# Patient Record
Sex: Female | Born: 1935 | Race: White | Hispanic: No | Marital: Married
Health system: Southern US, Community
[De-identification: ages and names within clinical notes are randomized; demographics above are authoritative.]

---

## 2014-02-14 ENCOUNTER — Inpatient Hospital Stay
Admission: AD | Admit: 2014-02-14 | Discharge: 2014-03-09 | Disposition: A | Discharge status: Hospice | Payer: Self-pay | Source: Ambulatory Visit | Attending: Internal Medicine | Admitting: Internal Medicine

## 2014-02-15 ENCOUNTER — Other Ambulatory Visit (HOSPITAL_COMMUNITY): Payer: Self-pay

## 2014-02-15 LAB — BASIC METABOLIC PANEL
Anion gap: 14 (ref 5–15)
BUN: 27 mg/dL — ABNORMAL HIGH (ref 6–23)
CALCIUM: 9 mg/dL (ref 8.4–10.5)
CO2: 28 mEq/L (ref 19–32)
CREATININE: 0.56 mg/dL (ref 0.50–1.10)
Chloride: 96 mEq/L (ref 96–112)
GFR calc Af Amer: 88 mL/min — ABNORMAL LOW (ref >90)
GFR, EST NON AFRICAN AMERICAN: 76 mL/min — AB (ref >90)
Glucose, Bld: 240 mg/dL — ABNORMAL HIGH (ref 70–99)
Potassium: 4 mEq/L (ref 3.7–5.3)
Sodium: 138 mEq/L (ref 137–147)

## 2014-02-15 LAB — CBC
HEMATOCRIT: 44.3 % (ref 36.0–46.0)
Hemoglobin: 14.3 g/dL (ref 12.0–15.0)
MCH: 30.4 pg (ref 26.0–34.0)
MCHC: 32.3 g/dL (ref 30.0–36.0)
MCV: 94.1 fL (ref 78.0–100.0)
PLATELETS: 259 10*3/uL (ref 150–400)
RBC: 4.71 MIL/uL (ref 3.87–5.11)
RDW: 13.2 % (ref 11.5–15.5)
WBC: 17.5 10*3/uL — AB (ref 4.0–10.5)

## 2014-02-15 LAB — PROCALCITONIN

## 2014-02-15 LAB — TSH: TSH: 0.258 u[IU]/mL — ABNORMAL LOW (ref 0.350–4.500)

## 2014-02-15 LAB — PREALBUMIN: Prealbumin: 27.2 mg/dL (ref 17.0–34.0)

## 2014-02-16 NOTE — Progress Notes (Signed)
[facility]                                                                                              Progress note     Patient Demographics  Crystal Hodges, is a 80 y.o. female  ZOX:[id number]  [id number]  DOB - 09-Nov-1914  Admit date - 02/14/2014  Admitting Physician Elnora Morrison, MD  Outpatient Primary MD for the patient is Pcp Not In System  LOS - 2   Chief complaint      Respiratory failure   COPD   Right lower lobe pneumonia   Diabetes mellitus type 2   Hypertension   Subjective:   Crystal Hodges today has,  complains of shortness of breath and anxiety  Objective:   Vital signs  Temperature 96  Heart rate 50  Respiratory rate 20  Blood pressure110/60  Pulse ox 99%    Exam Awake Alert,  confused, No new F.N deficits, [place].AT,PERRAL Supple Neck,No JVD, No cervical lymphadenopathy appriciated.  Symmetrical Chest wall movement,  decreased breath sounds bilaterally with scattered rhonchi  RRR,No Gallops,Rubs or new Murmurs, No Parasternal Heave +ve B.Sounds, Abd Soft, Non tender, No organomegaly appriciated, No rebound - guarding or rigidity. No Cyanosis, Clubbing or edema, No new Rash or bruise     I&Os -1665       Data Review   CBC  Recent Labs Lab 02/15/14 0555  WBC 17.5*  HGB 14.3  HCT 44.3  PLT 259  MCV 94.1  MCH 30.4  MCHC 32.3  RDW 13.2    Chemistries   Recent Labs Lab 02/15/14 0555  NA 138  K 4.0  CL 96  CO2 28  GLUCOSE 240*  BUN 27*  CREATININE 0.56  CALCIUM 9.0   ------------------------------------------------------------------------------------------------------------------ CrCl is unknown because there is no height on file for the current visit. ------------------------------------------------------------------------------------------------------------------ No results found for this basename: HGBA1C,  in the last 72  hours ------------------------------------------------------------------------------------------------------------------ No results found for this basename: CHOL, HDL, LDLCALC, TRIG, CHOLHDL, LDLDIRECT,  in the last 72 hours ------------------------------------------------------------------------------------------------------------------  Recent Labs  02/15/14 0555  TSH 0.258*   ------------------------------------------------------------------------------------------------------------------ No results found for this basename: VITAMINB12, FOLATE, FERRITIN, TIBC, IRON, RETICCTPCT,  in the last 72 hours  Coagulation profile No results found for this basename: INR, PROTIME,  in the last 168 hours  No results found for this basename: DDIMER,  in the last 72 hours  Cardiac Enzymes No results found for this basename: CK, CKMB, TROPONINI, MYOGLOBIN,  in the last 168 hours ------------------------------------------------------------------------------------------------------------------ No components found with this basename: POCBNP,   Micro Results No results found for this or any previous visit (from the past 240 hour(s)).     Assessment & Plan    respiratory failure, acute and in pain with high flow oxygen Right lower lobe pneumonia on Rocephin and Zithromax COPD continue with bronchodilators and steroids Hypertension controlled Diabetes mellitus type 2 on Levemir and sliding scale Hyperlipidemia on statin Elevated troponin with ejection fraction by echo 50% likely demand ischemia Generalized weakness PT and OT following   Code Status: DO NOT RESUSCITATE   DVT  Prophylaxis   Lovenox   Carron CurieHijazi, Chadd Tollison M.D on 02/16/2014 at 10:33 AM

## 2014-02-20 ENCOUNTER — Other Ambulatory Visit (HOSPITAL_COMMUNITY): Payer: Self-pay

## 2014-02-20 LAB — BASIC METABOLIC PANEL
Anion gap: 11 (ref 5–15)
BUN: 29 mg/dL — AB (ref 6–23)
CALCIUM: 8.9 mg/dL (ref 8.4–10.5)
CO2: 31 mEq/L (ref 19–32)
Chloride: 97 mEq/L (ref 96–112)
Creatinine, Ser: 0.59 mg/dL (ref 0.50–1.10)
GFR, EST AFRICAN AMERICAN: 87 mL/min — AB (ref >90)
GFR, EST NON AFRICAN AMERICAN: 75 mL/min — AB (ref >90)
GLUCOSE: 193 mg/dL — AB (ref 70–99)
POTASSIUM: 5 meq/L (ref 3.7–5.3)
Sodium: 139 mEq/L (ref 137–147)

## 2014-02-20 LAB — BLOOD GAS, ARTERIAL
Acid-Base Excess: 7.1 mmol/L — ABNORMAL HIGH (ref 0.0–2.0)
BICARBONATE: 31.5 meq/L — AB (ref 20.0–24.0)
Delivery systems: POSITIVE
Expiratory PAP: 6
FIO2: 0.6 %
Inspiratory PAP: 12
O2 Saturation: 95.1 %
PH ART: 7.428 (ref 7.350–7.450)
Patient temperature: 98.6
TCO2: 33 mmol/L (ref 0–100)
pCO2 arterial: 48.6 mmHg — ABNORMAL HIGH (ref 35.0–45.0)
pO2, Arterial: 74.2 mmHg — ABNORMAL LOW (ref 80.0–100.0)

## 2014-02-20 LAB — CBC
HCT: 40.5 % (ref 36.0–46.0)
HEMOGLOBIN: 13 g/dL (ref 12.0–15.0)
MCH: 30.4 pg (ref 26.0–34.0)
MCHC: 32.1 g/dL (ref 30.0–36.0)
MCV: 94.8 fL (ref 78.0–100.0)
Platelets: 236 10*3/uL (ref 150–400)
RBC: 4.27 MIL/uL (ref 3.87–5.11)
RDW: 13.7 % (ref 11.5–15.5)
WBC: 16 10*3/uL — ABNORMAL HIGH (ref 4.0–10.5)

## 2014-02-21 LAB — POTASSIUM: Potassium: 4.3 mEq/L (ref 3.7–5.3)

## 2014-02-21 NOTE — Progress Notes (Signed)
[facility]                                                                                              Progress note     Patient Demographics  Crystal Hodges, is a 80 y.o. female  ZOX:[id number]  [id number]  DOB - 06-06-1915  Admit date - 02/14/2014  Admitting Physician Elnora MorrisonAhmad B Barakat, MD  Outpatient Primary MD for the patient is Pcp Not In System  LOS - 7   Chief complaint      Respiratory failure   COPD   Right lower lobe pneumonia   Diabetes mellitus type 2   Hypertension   Subjective:   Crystal Hodges today he feels better, events of yesterday reviewed.  Objective:   Vital signs  Temperature 98.4  Heart rate 63  Respiratory rate 18 Blood pressure 133/70  Pulse ox 93%    Exam Awake Alert,  confused, No new F.N deficits, [place].AT,PERRAL Supple Neck,No JVD, No cervical lymphadenopathy appriciated.  Symmetrical Chest wall movement,  decreased breath sounds bilaterally with scattered rhonchi  RRR,No Gallops,Rubs or new Murmurs, No Parasternal Heave +ve B.Sounds, Abd Soft, Non tender, No organomegaly appriciated, No rebound - guarding or rigidity. No Cyanosis, Clubbing or edema, No new Rash or bruise     I&Os Unknown       Data Review   CBC  Recent Labs Lab 02/15/14 0555 02/20/14 0445  WBC 17.5* 16.0*  HGB 14.3 13.0  HCT 44.3 40.5  PLT 259 236  MCV 94.1 94.8  MCH 30.4 30.4  MCHC 32.3 32.1  RDW 13.2 13.7    Chemistries   Recent Labs Lab 02/15/14 0555 02/20/14 0445 02/21/14 0500  NA 138 139  --   K 4.0 5.0 4.3  CL 96 97  --   CO2 28 31  --   GLUCOSE 240* 193*  --   BUN 27* 29*  --   CREATININE 0.56 0.59  --   CALCIUM 9.0 8.9  --    ------------------------------------------------------------------------------------------------------------------ CrCl is unknown because there is no height on file for the current  visit. ------------------------------------------------------------------------------------------------------------------ No results found for this basename: HGBA1C,  in the last 72 hours ------------------------------------------------------------------------------------------------------------------ No results found for this basename: CHOL, HDL, LDLCALC, TRIG, CHOLHDL, LDLDIRECT,  in the last 72 hours ------------------------------------------------------------------------------------------------------------------ No results found for this basename: TSH, T4TOTAL, FREET3, T3FREE, THYROIDAB,  in the last 72 hours ------------------------------------------------------------------------------------------------------------------ No results found for this basename: VITAMINB12, FOLATE, FERRITIN, TIBC, IRON, RETICCTPCT,  in the last 72 hours  Coagulation profile No results found for this basename: INR, PROTIME,  in the last 168 hours  No results found for this basename: DDIMER,  in the last 72 hours  Cardiac Enzymes No results found for this basename: CK, CKMB, TROPONINI, MYOGLOBIN,  in the last 168 hours ------------------------------------------------------------------------------------------------------------------ No components found with this basename: POCBNP,   Micro Results No results found for this or any previous visit (from the past 240 hour(s)).     Assessment & Plan    respiratory failure, acute on chronic with high flow oxygen/Neb Rxs; probable aspiration Right lower lobe pneumonia on  meropenem COPD continue with bronchodilators and steroids by mouth Hypertension controlled Diabetes mellitus type 2 on Levemir and sliding scale Hyperlipidemia on statin Elevated troponin with ejection fraction by echo 50% likely demand ischemia Generalized weakness PT and OT following Protein calorie malnutrition/dysphasia continue on dysphagia 2 with nectar thickness  Plan  Continue same  treatment  No change  Grave prognosis Code Status: DO NOT RESUSCITATE   DVT Prophylaxis   Lovenox   Carron Curie M.D on 02/21/2014 at 1:27 PM

## 2014-02-22 NOTE — Progress Notes (Signed)
[facility]                                                                                              Progress note     Patient Demographics  Crystal Hodges, is a 80 y.o. female  ZOX:[id number]  [id number]  DOB - August 21, 1914  Admit date - 02/14/2014  Admitting Physician Elnora Morrison, MD  Outpatient Primary MD for the patient is Pcp Not In System  LOS - 8   Chief complaint      Respiratory failure   COPD   Right lower lobe pneumonia   Diabetes mellitus type 2   Hypertension   Subjective:   Crystal Hodges has no new complaints.  Objective:   Vital signs  Temperature 96.2 Heart rate 60  Respiratory rate 18 Blood pressure 106/54  Pulse ox 100%    Exam Awake Alert,  confused, No new F.N deficits, [place].AT,PERRAL Supple Neck,No JVD, No cervical lymphadenopathy appriciated.  Symmetrical Chest wall movement,  decreased breath sounds bilaterally with scattered rhonchi  RRR,No Gallops,Rubs or new Murmurs, No Parasternal Heave +ve B.Sounds, Abd Soft, Non tender, No organomegaly appriciated, No rebound - guarding or rigidity. No Cyanosis, Clubbing or edema, No new Rash or bruise     I&Os Unknown       Data Review   CBC  Recent Labs Lab 02/20/14 0445  WBC 16.0*  HGB 13.0  HCT 40.5  PLT 236  MCV 94.8  MCH 30.4  MCHC 32.1  RDW 13.7    Chemistries   Recent Labs Lab 02/20/14 0445 02/21/14 0500  NA 139  --   K 5.0 4.3  CL 97  --   CO2 31  --   GLUCOSE 193*  --   BUN 29*  --   CREATININE 0.59  --   CALCIUM 8.9  --    ------------------------------------------------------------------------------------------------------------------ CrCl is unknown because there is no height on file for the current visit. ------------------------------------------------------------------------------------------------------------------ No results found for this basename:  HGBA1C,  in the last 72 hours ------------------------------------------------------------------------------------------------------------------ No results found for this basename: CHOL, HDL, LDLCALC, TRIG, CHOLHDL, LDLDIRECT,  in the last 72 hours ------------------------------------------------------------------------------------------------------------------ No results found for this basename: TSH, T4TOTAL, FREET3, T3FREE, THYROIDAB,  in the last 72 hours ------------------------------------------------------------------------------------------------------------------ No results found for this basename: VITAMINB12, FOLATE, FERRITIN, TIBC, IRON, RETICCTPCT,  in the last 72 hours  Coagulation profile No results found for this basename: INR, PROTIME,  in the last 168 hours  No results found for this basename: DDIMER,  in the last 72 hours  Cardiac Enzymes No results found for this basename: CK, CKMB, TROPONINI, MYOGLOBIN,  in the last 168 hours ------------------------------------------------------------------------------------------------------------------ No components found with this basename: POCBNP,   Micro Results No results found for this or any previous visit (from the past 240 hour(s)).     Assessment & Plan    respiratory failure, acute on chronic with high flow oxygen/Neb Rxs; probable aspiration Right lower lobe pneumonia on meropenem COPD continue with bronchodilators and steroids by mouth Hypertension controlled Diabetes mellitus type 2 on Levemir and sliding scale Hyperlipidemia on statin Elevated troponin  with ejection fraction by echo 50% likely demand ischemia Generalized weakness PT and OT following Protein calorie malnutrition/dysphasia continue on dysphagia 2 with nectar thickness  Plan  Continue same treatment  No change  Grave prognosis Code Status: DO NOT RESUSCITATE   DVT Prophylaxis   Lovenox   Carron CurieHijazi, Crystal Hodges M.D on 02/22/2014 at 12:12 PM

## 2014-02-23 ENCOUNTER — Other Ambulatory Visit (HOSPITAL_COMMUNITY): Payer: Self-pay

## 2014-02-23 LAB — CBC
HCT: 42.3 % (ref 36.0–46.0)
HEMOGLOBIN: 13.3 g/dL (ref 12.0–15.0)
MCH: 30.3 pg (ref 26.0–34.0)
MCHC: 31.4 g/dL (ref 30.0–36.0)
MCV: 96.4 fL (ref 78.0–100.0)
Platelets: 235 10*3/uL (ref 150–400)
RBC: 4.39 MIL/uL (ref 3.87–5.11)
RDW: 13.8 % (ref 11.5–15.5)
WBC: 14.9 10*3/uL — ABNORMAL HIGH (ref 4.0–10.5)

## 2014-02-23 LAB — BASIC METABOLIC PANEL
Anion gap: 8 (ref 5–15)
Anion gap: 12 (ref 5–15)
BUN: 39 mg/dL — ABNORMAL HIGH (ref 6–23)
BUN: 41 mg/dL — ABNORMAL HIGH (ref 6–23)
CALCIUM: 9 mg/dL (ref 8.4–10.5)
CO2: 32 mEq/L (ref 19–32)
CO2: 29 mEq/L (ref 19–32)
Calcium: 8.7 mg/dL (ref 8.4–10.5)
Chloride: 99 mEq/L (ref 96–112)
Chloride: 96 mEq/L (ref 96–112)
Creatinine, Ser: 0.54 mg/dL (ref 0.50–1.10)
Creatinine, Ser: 0.5 mg/dL (ref 0.50–1.10)
GFR calc Af Amer: 89 mL/min — ABNORMAL LOW (ref >90)
GFR, EST NON AFRICAN AMERICAN: 77 mL/min — AB (ref >90)
GFR, EST NON AFRICAN AMERICAN: 79 mL/min — AB (ref >90)
GLUCOSE: 132 mg/dL — AB (ref 70–99)
Glucose, Bld: 196 mg/dL — ABNORMAL HIGH (ref 70–99)
POTASSIUM: 4.5 meq/L (ref 3.7–5.3)
Potassium: 4.5 mEq/L (ref 3.7–5.3)
SODIUM: 137 meq/L (ref 137–147)
Sodium: 139 mEq/L (ref 137–147)

## 2014-02-23 LAB — MAGNESIUM: MAGNESIUM: 2.3 mg/dL (ref 1.5–2.5)

## 2014-02-23 LAB — TROPONIN I: Troponin I: <0.3 ng/mL (ref <0.30)

## 2014-02-23 NOTE — Progress Notes (Addendum)
[facility]                                                                                              Progress note     Patient Demographics  Crystal Hodges, is a 80 y.o. female  [id number]  [id number]  DOB - May 13, 1915  Admit date - 02/14/2014  Admitting Physician Elnora MorrisonAhmad B Barakat, MD  Outpatient Primary MD for the patient is Pcp Not In System  LOS - 9   Chief complaint      Respiratory failure   COPD   Right lower lobe pneumonia   Diabetes mellitus type 2   Hypertension   Subjective:   Crystal Hodges has no new complaints.  Objective:   Vital signs  Temperature 96.5 Heart rate 53  Respiratory rate 18 Blood pressure 113/52  Pulse ox 100%    Exam Awake Alert,  confused, No new F.N deficits, [place].AT,PERRAL Supple Neck,No JVD, No cervical lymphadenopathy appriciated.  Symmetrical Chest wall movement,  decreased breath sounds bilaterally with scattered rhonchi  RRR,No Gallops,Rubs or new Murmurs, No Parasternal Heave +ve B.Sounds, Abd Soft, Non tender, No organomegaly appriciated, No rebound - guarding or rigidity. No Cyanosis, Clubbing or edema, No new Rash or bruise     I&Os Unknown       Data Review   CBC  Recent Labs Lab 02/20/14 0445 02/23/14 0900  WBC 16.0* 14.9*  HGB 13.0 13.3  HCT 40.5 42.3  PLT 236 235  MCV 94.8 96.4  MCH 30.4 30.3  MCHC 32.1 31.4  RDW 13.7 13.8    Chemistries   Recent Labs Lab 02/20/14 0445 02/21/14 0500 02/23/14 0900  NA 139  --  139  K 5.0 4.3 4.5  CL 97  --  99  CO2 31  --  32  GLUCOSE 193*  --  132*  BUN 29*  --  39*  CREATININE 0.59  --  0.54  CALCIUM 8.9  --  8.7   ------------------------------------------------------------------------------------------------------------------ CrCl is unknown because there is no height on file for the current  visit. ------------------------------------------------------------------------------------------------------------------ No results found for this basename: HGBA1C,  in the last 72 hours ------------------------------------------------------------------------------------------------------------------ No results found for this basename: CHOL, HDL, LDLCALC, TRIG, CHOLHDL, LDLDIRECT,  in the last 72 hours ------------------------------------------------------------------------------------------------------------------ No results found for this basename: TSH, T4TOTAL, FREET3, T3FREE, THYROIDAB,  in the last 72 hours ------------------------------------------------------------------------------------------------------------------ No results found for this basename: VITAMINB12, FOLATE, FERRITIN, TIBC, IRON, RETICCTPCT,  in the last 72 hours  Coagulation profile No results found for this basename: INR, PROTIME,  in the last 168 hours  No results found for this basename: DDIMER,  in the last 72 hours  Cardiac Enzymes No results found for this basename: CK, CKMB, TROPONINI, MYOGLOBIN,  in the last 168 hours ------------------------------------------------------------------------------------------------------------------ No components found with this basename: POCBNP,   Micro Results No results found for this or any previous visit (from the past 240 hour(s)).     Assessment & Plan   Respiratory failure, acute on chronic with high flow oxygen/Neb Rxs;  Right lower lobe pneumonia on meropenem COPD continue with bronchodilators and steroids  by mouth Hypertension controlled Diabetes mellitus type 2 on Levemir and sliding scale Hyperlipidemia on statin Elevated troponin with ejection fraction by echo 50% likely demand ischemia Generalized weakness PT and OT following Protein calorie malnutrition/dysphasia continue on dysphagia 2 with nectar thickness  Plan  Continue same treatment  No  change  Grave prognosis Code Status: DO NOT RESUSCITATE   DVT Prophylaxis   Lovenox Addendum/critical care time 45 minutes Patient developed acute respiratory distress while at the commode. She was placed on 100%/BiPAP Chest x-ray ordered KUB ordered On physical examination Abdomen protuberant Chest scattered rhonchi and wheezing Patient received Lasix IV, albuterol by nebulizer, morphine sulfate 2 mg IV Serial troponins ordered    Carron Curie M.D on 02/23/2014 at 11:11 AM

## 2014-02-24 ENCOUNTER — Other Ambulatory Visit (HOSPITAL_COMMUNITY): Payer: Self-pay

## 2014-02-24 LAB — TROPONIN I: Troponin I: <0.3 ng/mL (ref <0.30)

## 2014-02-24 NOTE — Progress Notes (Signed)
[facility]                                                                                              Progress note     Patient Demographics  Crystal Hodges, is a 80 y.o. female  WUJ:[id number]  [id number]  DOB - 01/02/1915  Admit date - 02/14/2014  Admitting Physician Elnora Morrison, MD  Outpatient Primary MD for the patient is Pcp Not In System  LOS - 10   Chief complaint      Respiratory failure   COPD   Right lower lobe pneumonia   Diabetes mellitus type 2   Hypertension   Subjective:   Makhia Vosler has no new complaints.  Objective:   Vital signs  Temperature 96.8 Heart rate 47  Respiratory rate 16 Blood pressure 113/60  Pulse ox 99%  Exam Awake Alert,  confused, No new F.N deficits, [place].AT,PERRAL Supple Neck,No JVD, No cervical lymphadenopathy appriciated.  Symmetrical Chest wall movement,  decreased breath sounds bilaterally with scattered rhonchi  RRR,No Gallops,Rubs or new Murmurs, No Parasternal Heave +ve B.Sounds, Abd Soft, Non tender, No organomegaly appriciated, No rebound - guarding or rigidity. No Cyanosis, Clubbing or edema, No new Rash or bruise     I&Os Unknown   Data Review   CBC  Recent Labs Lab 02/20/14 0445 02/23/14 0900  WBC 16.0* 14.9*  HGB 13.0 13.3  HCT 40.5 42.3  PLT 236 235  MCV 94.8 96.4  MCH 30.4 30.3  MCHC 32.1 31.4  RDW 13.7 13.8    Chemistries   Recent Labs Lab 02/20/14 0445 02/21/14 0500 02/23/14 0900 02/23/14 1435  NA 139  --  139 137  K 5.0 4.3 4.5 4.5  CL 97  --  99 96  CO2 31  --  32 29  GLUCOSE 193*  --  132* 196*  BUN 29*  --  39* 41*  CREATININE 0.59  --  0.54 0.50  CALCIUM 8.9  --  8.7 9.0  MG  --   --   --  2.3   ------------------------------------------------------------------------------------------------------------------ CrCl is unknown because there is no height on file for the  current visit. ------------------------------------------------------------------------------------------------------------------ No results found for this basename: HGBA1C,  in the last 72 hours ------------------------------------------------------------------------------------------------------------------ No results found for this basename: CHOL, HDL, LDLCALC, TRIG, CHOLHDL, LDLDIRECT,  in the last 72 hours ------------------------------------------------------------------------------------------------------------------ No results found for this basename: TSH, T4TOTAL, FREET3, T3FREE, THYROIDAB,  in the last 72 hours ------------------------------------------------------------------------------------------------------------------ No results found for this basename: VITAMINB12, FOLATE, FERRITIN, TIBC, IRON, RETICCTPCT,  in the last 72 hours  Coagulation profile No results found for this basename: INR, PROTIME,  in the last 168 hours  No results found for this basename: DDIMER,  in the last 72 hours  Cardiac Enzymes  Recent Labs Lab 02/23/14 1435 02/23/14 2045 02/24/14 0650  TROPONINI <0.30 <0.30 <0.30   ------------------------------------------------------------------------------------------------------------------ No components found with this basename: POCBNP,   Micro Results No results found for this or any previous visit (from the past 240 hour(s)).     Assessment & Plan   Respiratory failure, acute on chronic with  high flow oxygen/Neb Rxs;  Right lower lobe and retrocardiac pneumonia on meropenem COPD continue with bronchodilators and steroids by mouth Hypertension controlled Diabetes mellitus type 2 on Levemir and sliding scale Hyperlipidemia on statin Elevated troponin with ejection fraction by echo 50% likely demand ischemia Generalized weakness PT and OT following Protein calorie malnutrition/dysphasia continue on dysphagia 2 with nectar  thickness  Plan  Restart feeding  Grave prognosis Code Status: DO NOT RESUSCITATE   DVT Prophylaxis   Lovenox   Carron CurieHijazi, Tena Linebaugh M.D on 02/24/2014 at 2:29 PM

## 2014-02-25 NOTE — Progress Notes (Signed)
[facility]                                                                                              Progress note     Patient Demographics  Crystal Hodges, is a 80 y.o. female  XBJ:[id number]  [id number]  DOB - [birth year]  Admit date - 02/14/2014  Admitting Physician Elnora MorrisonAhmad B Barakat, MD  Outpatient Primary MD for the patient is Pcp Not In System  LOS - 11   Chief complaint      Respiratory failure   COPD   Right lower lobe pneumonia   Diabetes mellitus type 2   Hypertension   Subjective:   Crystal Hodges confused and cannot give any history.  Objective:   Vital signs  Temperature 96.8 Heart rate 57 Respiratory rate 16 Blood pressure 146/75  Pulse ox 100%  Exam Awake Alert,  confused, No new F.N deficits, [place].AT,PERRAL Supple Neck,No JVD, No cervical lymphadenopathy appriciated.  Symmetrical Chest wall movement,  decreased breath sounds bilaterally with scattered rhonchi  RRR,No Gallops,Rubs or new Murmurs, No Parasternal Heave +ve B.Sounds, Abd Soft, Non tender, No organomegaly appriciated, No rebound - guarding or rigidity. No Cyanosis, Clubbing or edema, No new Rash or bruise     I&Os Unknown   Data Review   CBC  Recent Labs Lab 02/20/14 0445 02/23/14 0900  WBC 16.0* 14.9*  HGB 13.0 13.3  HCT 40.5 42.3  PLT 236 235  MCV 94.8 96.4  MCH 30.4 30.3  MCHC 32.1 31.4  RDW 13.7 13.8    Chemistries   Recent Labs Lab 02/20/14 0445 02/21/14 0500 02/23/14 0900 02/23/14 1435  NA 139  --  139 137  K 5.0 4.3 4.5 4.5  CL 97  --  99 96  CO2 31  --  32 29  GLUCOSE 193*  --  132* 196*  BUN 29*  --  39* 41*  CREATININE 0.59  --  0.54 0.50  CALCIUM 8.9  --  8.7 9.0  MG  --   --   --  2.3   ------------------------------------------------------------------------------------------------------------------ CrCl is unknown because there is no height on file  for the current visit. ------------------------------------------------------------------------------------------------------------------ No results found for this basename: HGBA1C,  in the last 72 hours ------------------------------------------------------------------------------------------------------------------ No results found for this basename: CHOL, HDL, LDLCALC, TRIG, CHOLHDL, LDLDIRECT,  in the last 72 hours ------------------------------------------------------------------------------------------------------------------ No results found for this basename: TSH, T4TOTAL, FREET3, T3FREE, THYROIDAB,  in the last 72 hours ------------------------------------------------------------------------------------------------------------------ No results found for this basename: VITAMINB12, FOLATE, FERRITIN, TIBC, IRON, RETICCTPCT,  in the last 72 hours  Coagulation profile No results found for this basename: INR, PROTIME,  in the last 168 hours  No results found for this basename: DDIMER,  in the last 72 hours  Cardiac Enzymes  Recent Labs Lab 02/23/14 1435 02/23/14 2045 02/24/14 0650  TROPONINI <0.30 <0.30 <0.30   ------------------------------------------------------------------------------------------------------------------ No components found with this basename: POCBNP,   Micro Results No results found for this or any previous visit (from the past 240 hour(s)).     Assessment & Plan   Respiratory failure, acute on chronic  with high flow oxygen/Neb Rxs;  Right lower lobe and retrocardiac pneumonia on meropenem COPD continue with bronchodilators and steroids by mouth Hypertension controlled Diabetes mellitus type 2 on Levemir and sliding scale Hyperlipidemia on statin Elevated troponin with ejection fraction by echo 50% likely demand ischemia Generalized weakness PT and OT following Protein calorie malnutrition/dysphasia continue on dysphagia 2 with nectar  thickness  Plan  Continue same management  Grave prognosis Code Status: DO NOT RESUSCITATE   DVT Prophylaxis   Lovenox   Carron Curie M.D on 02/25/2014 at 1:27 PM

## 2014-02-27 LAB — CBC
HEMATOCRIT: 28.9 % — AB (ref 36.0–46.0)
Hemoglobin: 9.4 g/dL — ABNORMAL LOW (ref 12.0–15.0)
MCH: 31.1 pg (ref 26.0–34.0)
MCHC: 32.5 g/dL (ref 30.0–36.0)
MCV: 95.7 fL (ref 78.0–100.0)
PLATELETS: 111 10*3/uL — AB (ref 150–400)
RBC: 3.02 MIL/uL — ABNORMAL LOW (ref 3.87–5.11)
RDW: 13.9 % (ref 11.5–15.5)
WBC: 9.2 10*3/uL (ref 4.0–10.5)

## 2014-02-27 LAB — BASIC METABOLIC PANEL
Anion gap: 13 (ref 5–15)
BUN: 31 mg/dL — AB (ref 6–23)
CO2: 26 meq/L (ref 19–32)
Calcium: 8.9 mg/dL (ref 8.4–10.5)
Chloride: 101 mEq/L (ref 96–112)
Creatinine, Ser: 0.44 mg/dL — ABNORMAL LOW (ref 0.50–1.10)
GFR calc Af Amer: >90 mL/min (ref >90)
GFR, EST NON AFRICAN AMERICAN: 83 mL/min — AB (ref >90)
GLUCOSE: 116 mg/dL — AB (ref 70–99)
Potassium: 4.5 mEq/L (ref 3.7–5.3)
SODIUM: 140 meq/L (ref 137–147)

## 2014-02-27 LAB — PRO B NATRIURETIC PEPTIDE: Pro B Natriuretic peptide (BNP): 810.5 pg/mL — ABNORMAL HIGH (ref 0–450)

## 2014-02-28 NOTE — Progress Notes (Signed)
[facility]                                                                                              Progress note     Patient Demographics  Crystal Hodges, is a 80 y.o. female  UJW:[id number]  FAO:[id number]  DOB - 23-Nov-1914  Admit date - 02/14/2014  Admitting Physician Elnora MorrisonAhmad B Barakat, MD  Outpatient Primary MD for the patient is Pcp Not In System  LOS - 14   Chief complaint      Respiratory failure   COPD   Right lower lobe pneumonia   Diabetes mellitus type 2   Hypertension   Subjective:   Crystal Hodges confused and cannot give any history.  Objective:   Vital signs  Temperature 96.1 Heart rate 53 Respiratory rate 13 Blood pressure 124/68  Pulse ox 93%  Exam Awake Alert,  confused, No new F.N deficits, [place].AT,PERRAL Supple Neck,No JVD, No cervical lymphadenopathy appriciated.  Symmetrical Chest wall movement,  decreased breath sounds bilaterally with scattered rhonchi  RRR,No Gallops,Rubs or new Murmurs, No Parasternal Heave +ve B.Sounds, Abd Soft, Non tender, No organomegaly appriciated, No rebound - guarding or rigidity. No Cyanosis, Clubbing or edema, No new Rash or bruise     I&Os unknown    Data Review   CBC  Recent Labs Lab 02/23/14 0900 02/27/14 0500  WBC 14.9* 9.2  HGB 13.3 9.4*  HCT 42.3 28.9*  PLT 235 111*  MCV 96.4 95.7  MCH 30.3 31.1  MCHC 31.4 32.5  RDW 13.8 13.9    Chemistries   Recent Labs Lab 02/23/14 0900 02/23/14 1435 02/27/14 0500  NA 139 137 140  K 4.5 4.5 4.5  CL 99 96 101  CO2 32 29 26  GLUCOSE 132* 196* 116*  BUN 39* 41* 31*  CREATININE 0.54 0.50 0.44*  CALCIUM 8.7 9.0 8.9  MG  --  2.3  --    ------------------------------------------------------------------------------------------------------------------ CrCl is unknown because there is no height on file for the current  visit. ------------------------------------------------------------------------------------------------------------------ No results found for this basename: HGBA1C,  in the last 72 hours ------------------------------------------------------------------------------------------------------------------ No results found for this basename: CHOL, HDL, LDLCALC, TRIG, CHOLHDL, LDLDIRECT,  in the last 72 hours ------------------------------------------------------------------------------------------------------------------ No results found for this basename: TSH, T4TOTAL, FREET3, T3FREE, THYROIDAB,  in the last 72 hours ------------------------------------------------------------------------------------------------------------------ No results found for this basename: VITAMINB12, FOLATE, FERRITIN, TIBC, IRON, RETICCTPCT,  in the last 72 hours  Coagulation profile No results found for this basename: INR, PROTIME,  in the last 168 hours  No results found for this basename: DDIMER,  in the last 72 hours  Cardiac Enzymes  Recent Labs Lab 02/23/14 1435 02/23/14 2045 02/24/14 0650  TROPONINI <0.30 <0.30 <0.30   ------------------------------------------------------------------------------------------------------------------ No components found with this basename: POCBNP,   Micro Results No results found for this or any previous visit (from the past 240 hour(s)).     Assessment & Plan   Respiratory failure, acute on chronic with high flow oxygen/Neb Rxs;  Right lower lobe and retrocardiac pneumonia on meropenem COPD continue with bronchodilators and steroids by mouth Hypertension controlled Diabetes mellitus  type 2 on Levemir and sliding scale Hyperlipidemia on statin Elevated troponin with ejection fraction by echo 50% likely demand ischemia Generalized weakness PT and OT following Protein calorie malnutrition/dysphasia continue on dysphagia 2 with nectar thickness  Plan  Continue  same management  Grave prognosis Code Status: DO NOT RESUSCITATE   DVT Prophylaxis   Lovenox   Carron Curie M.D on 02/28/2014 at 12:50 PM

## 2014-03-01 NOTE — Progress Notes (Signed)
[facility]                                                                                              Progress note     Patient Demographics  Crystal Hodges, is a 80 y.o. female  [id number]:[id number]  HQI:[id number]  DOB - 22-Oct-1914  Admit date - 02/14/2014  Admitting Physician Elnora Morrison, MD  Outpatient Primary MD for the patient is Pcp Not In System  LOS - 15   Chief complaint      Respiratory failure   COPD   Right lower lobe pneumonia   Diabetes mellitus type 2   Hypertension   Subjective:   [name] confused and cannot give any history.  Objective:   Vital signs  Temperature 97.5 Heart rate 70 Respiratory rate 20 Blood pressure 130/72 Pulse ox 99%  Exam Awake Alert,  confused, No new F.N deficits, [place].AT,PERRAL Supple Neck,No JVD, No cervical lymphadenopathy appriciated.  Symmetrical Chest wall movement,  decreased breath sounds bilaterally with scattered rhonchi  RRR,No Gallops,Rubs or new Murmurs, No Parasternal Heave +ve B.Sounds, Abd Soft, Non tender, No organomegaly appriciated, No rebound - guarding or rigidity. No Cyanosis, Clubbing or edema, No new Rash or bruise     I&Os unknown    Data Review   CBC  Recent Labs Lab 02/23/14 0900 02/27/14 0500  WBC 14.9* 9.2  HGB 13.3 9.4*  HCT 42.3 28.9*  PLT 235 111*  MCV 96.4 95.7  MCH 30.3 31.1  MCHC 31.4 32.5  RDW 13.8 13.9    Chemistries   Recent Labs Lab 02/23/14 0900 02/23/14 1435 02/27/14 0500  NA 139 137 140  K 4.5 4.5 4.5  CL 99 96 101  CO2 32 29 26  GLUCOSE 132* 196* 116*  BUN 39* 41* 31*  CREATININE 0.54 0.50 0.44*  CALCIUM 8.7 9.0 8.9  MG  --  2.3  --    ------------------------------------------------------------------------------------------------------------------ CrCl is unknown because there is no height on file for the current  visit. ------------------------------------------------------------------------------------------------------------------ No results found for this basename: HGBA1C,  in the last 72 hours ------------------------------------------------------------------------------------------------------------------ No results found for this basename: CHOL, HDL, LDLCALC, TRIG, CHOLHDL, LDLDIRECT,  in the last 72 hours ------------------------------------------------------------------------------------------------------------------ No results found for this basename: TSH, T4TOTAL, FREET3, T3FREE, THYROIDAB,  in the last 72 hours ------------------------------------------------------------------------------------------------------------------ No results found for this basename: VITAMINB12, FOLATE, FERRITIN, TIBC, IRON, RETICCTPCT,  in the last 72 hours  Coagulation profile No results found for this basename: INR, PROTIME,  in the last 168 hours  No results found for this basename: DDIMER,  in the last 72 hours  Cardiac Enzymes  Recent Labs Lab 02/23/14 1435 02/23/14 2045 02/24/14 0650  TROPONINI <0.30 <0.30 <0.30   ------------------------------------------------------------------------------------------------------------------ No components found with this basename: POCBNP,   Micro Results No results found for this or any previous visit (from the past 240 hour(s)).     Assessment & Plan   Respiratory failure, acute on chronic with high flow oxygen/Neb Rxs;  Right lower lobe and retrocardiac pneumonia on meropenem COPD continue with bronchodilators and steroids by mouth Hypertension controlled Diabetes mellitus type  2 on Levemir and sliding scale Hyperlipidemia on statin Elevated troponin with ejection fraction by echo 50% likely demand ischemia Generalized weakness PT and OT following Protein calorie malnutrition/dysphasia continue on dysphagia 2 with nectar thickness  Plan  Continue  same treatment  Grave prognosis Code Status: DO NOT RESUSCITATE   DVT Prophylaxis   Lovenox   Carron CurieHijazi, Herson Prichard M.D on 03/01/2014 at 1:46 PM

## 2014-03-02 NOTE — Progress Notes (Signed)
[facility]                                                                                              Progress note     Patient Demographics  Crystal Hodges, is a 80 y.o. female  [id number]  [id number]  DOB - 02/09/1915  Admit date - 02/14/2014  Admitting Physician Elnora MorrisonAhmad B Barakat, MD  Outpatient Primary MD for the patient is Pcp Not In System  LOS - 16   Chief complaint      Respiratory failure   COPD   Right lower lobe pneumonia   Diabetes mellitus type 2   Hypertension   Subjective:   Crystal Hodges is pleasantly confused and cannot give any history  Objective:   Vital signs  Temperature 97.8 Heart rate 64 Respiratory rate 19 Blood pressure 106/57 Pulse ox 96%  Exam Awake Alert,  confused, No new F.N deficits, [place].AT,PERRAL Supple Neck,No JVD, No cervical lymphadenopathy appriciated.  Symmetrical Chest wall movement,  decreased breath sounds bilaterally with scattered rhonchi  RRR,No Gallops,Rubs or new Murmurs, No Parasternal Heave +ve B.Sounds, Abd Soft, Non tender, No organomegaly appriciated, No rebound - guarding or rigidity. No Cyanosis, Clubbing or edema, No new Rash or bruise     I&Os unknown    Data Review   CBC  Recent Labs Lab 02/27/14 0500  WBC 9.2  HGB 9.4*  HCT 28.9*  PLT 111*  MCV 95.7  MCH 31.1  MCHC 32.5  RDW 13.9    Chemistries   Recent Labs Lab 02/23/14 1435 02/27/14 0500  NA 137 140  K 4.5 4.5  CL 96 101  CO2 29 26  GLUCOSE 196* 116*  BUN 41* 31*  CREATININE 0.50 0.44*  CALCIUM 9.0 8.9  MG 2.3  --    ------------------------------------------------------------------------------------------------------------------ CrCl is unknown because there is no height on file for the current visit. ------------------------------------------------------------------------------------------------------------------ No results  found for this basename: HGBA1C,  in the last 72 hours ------------------------------------------------------------------------------------------------------------------ No results found for this basename: CHOL, HDL, LDLCALC, TRIG, CHOLHDL, LDLDIRECT,  in the last 72 hours ------------------------------------------------------------------------------------------------------------------ No results found for this basename: TSH, T4TOTAL, FREET3, T3FREE, THYROIDAB,  in the last 72 hours ------------------------------------------------------------------------------------------------------------------ No results found for this basename: VITAMINB12, FOLATE, FERRITIN, TIBC, IRON, RETICCTPCT,  in the last 72 hours  Coagulation profile No results found for this basename: INR, PROTIME,  in the last 168 hours  No results found for this basename: DDIMER,  in the last 72 hours  Cardiac Enzymes  Recent Labs Lab 02/23/14 1435 02/23/14 2045 02/24/14 0650  TROPONINI <0.30 <0.30 <0.30   ------------------------------------------------------------------------------------------------------------------ No components found with this basename: POCBNP,   Micro Results No results found for this or any previous visit (from the past 240 hour(s)).     Assessment & Plan   Respiratory failure, acute on chronic with high flow oxygen/Neb Rxs;  Right lower lobe and retrocardiac pneumonia on meropenem COPD continue with bronchodilators and steroids by mouth Hypertension controlled Diabetes mellitus type 2 on Levemir and sliding scale Hyperlipidemia on statin Elevated troponin with ejection fraction by echo 50% likely demand ischemia Generalized  weakness PT and OT following Protein calorie malnutrition/dysphasia continue on dysphagia 2 with nectar thickness  Plan  Check labs in a.m. Discussed with family with no escalation of care  Grave prognosis Code Status: DO NOT RESUSCITATE   DVT Prophylaxis    Lovenox   Carron Curie M.D on 03/02/2014 at 11:43 AM

## 2014-03-03 LAB — CBC
HCT: 25 % — ABNORMAL LOW (ref 36.0–46.0)
Hemoglobin: 7.9 g/dL — ABNORMAL LOW (ref 12.0–15.0)
MCH: 32.1 pg (ref 26.0–34.0)
MCHC: 31.6 g/dL (ref 30.0–36.0)
MCV: 101.6 fL — AB (ref 78.0–100.0)
Platelets: 119 10*3/uL — ABNORMAL LOW (ref 150–400)
RBC: 2.46 MIL/uL — AB (ref 3.87–5.11)
RDW: 19.2 % — ABNORMAL HIGH (ref 11.5–15.5)
WBC: 9.9 10*3/uL (ref 4.0–10.5)

## 2014-03-03 LAB — BASIC METABOLIC PANEL
Anion gap: 7 (ref 5–15)
BUN: 25 mg/dL — ABNORMAL HIGH (ref 6–23)
CALCIUM: 9.2 mg/dL (ref 8.4–10.5)
CO2: 31 meq/L (ref 19–32)
CREATININE: 0.33 mg/dL — AB (ref 0.50–1.10)
Chloride: 105 mEq/L (ref 96–112)
GFR calc Af Amer: >90 mL/min (ref >90)
GLUCOSE: 114 mg/dL — AB (ref 70–99)
Potassium: 4.2 mEq/L (ref 3.7–5.3)
Sodium: 143 mEq/L (ref 137–147)

## 2014-03-03 NOTE — Progress Notes (Signed)
[facility]                                                                                              Progress note     Patient Demographics  Crystal Hodges, is a 80 y.o. female  ZOX:[id number]  [id number]  DOB - May 31, 1915  Admit date - 02/14/2014  Admitting Physician Elnora Morrison, MD  Outpatient Primary MD for the patient is Pcp Not In System  LOS - 17   Chief complaint      Respiratory failure   COPD   Right lower lobe pneumonia   Diabetes mellitus type 2   Hypertension   Subjective:   Mariaisabel Bodiford has no complaints today  Objective:   Vital signs  Temperature 96.4 Heart rate 58 Respiratory rate 15 Blood pressure 134/78 Pulse ox 90%  Exam Awake Alert,  confused, No new F.N deficits, [place].AT,PERRAL Supple Neck,No JVD, No cervical lymphadenopathy appriciated.  Symmetrical Chest wall movement,  decreased breath sounds bilaterally with scattered rhonchi  RRR,No Gallops,Rubs or new Murmurs, No Parasternal Heave +ve B.Sounds, Abd Soft, Non tender, No organomegaly appriciated, No rebound - guarding or rigidity. No Cyanosis, Clubbing or edema, No new Rash or bruise     I&Os unknown    Data Review   CBC  Recent Labs Lab 02/27/14 0500 03/03/14 0500  WBC 9.2 9.9  HGB 9.4* 7.9*  HCT 28.9* 25.0*  PLT 111* 119*  MCV 95.7 101.6*  MCH 31.1 32.1  MCHC 32.5 31.6  RDW 13.9 19.2*    Chemistries   Recent Labs Lab 02/27/14 0500 03/03/14 0500  NA 140 143  K 4.5 4.2  CL 101 105  CO2 26 31  GLUCOSE 116* 114*  BUN 31* 25*  CREATININE 0.44* 0.33*  CALCIUM 8.9 9.2   ------------------------------------------------------------------------------------------------------------------ CrCl is unknown because there is no height on file for the current  visit. ------------------------------------------------------------------------------------------------------------------ No results found for this basename: HGBA1C,  in the last 72 hours ------------------------------------------------------------------------------------------------------------------ No results found for this basename: CHOL, HDL, LDLCALC, TRIG, CHOLHDL, LDLDIRECT,  in the last 72 hours ------------------------------------------------------------------------------------------------------------------ No results found for this basename: TSH, T4TOTAL, FREET3, T3FREE, THYROIDAB,  in the last 72 hours ------------------------------------------------------------------------------------------------------------------ No results found for this basename: VITAMINB12, FOLATE, FERRITIN, TIBC, IRON, RETICCTPCT,  in the last 72 hours  Coagulation profile No results found for this basename: INR, PROTIME,  in the last 168 hours  No results found for this basename: DDIMER,  in the last 72 hours  Cardiac Enzymes No results found for this basename: CK, CKMB, TROPONINI, MYOGLOBIN,  in the last 168 hours ------------------------------------------------------------------------------------------------------------------ No components found with this basename: POCBNP,   Micro Results No results found for this or any previous visit (from the past 240 hour(s)).     Assessment & Plan   Respiratory failure, acute on chronic with high flow oxygen/Neb Rxs;  Right lower lobe and retrocardiac pneumonia on meropenem COPD continue with bronchodilators and steroids by mouth Hypertension controlled Diabetes mellitus type 2 on Levemir and sliding scale Hyperlipidemia on statin Elevated troponin with ejection fraction by echo 50% likely demand ischemia Generalized weakness  PT and OT following Protein calorie malnutrition/dysphasia continue on dysphagia 2 with nectar thickness Anemia w drop in  HB  Plan  Check CBC in a.m. Continue same medications  Grave prognosis Code Status: DO NOT RESUSCITATE   DVT Prophylaxis   Lovenox   Carron CurieHijazi, Iyah Laguna M.D on 03/03/2014 at 11:49 AM

## 2014-03-04 ENCOUNTER — Other Ambulatory Visit (HOSPITAL_COMMUNITY): Payer: Self-pay

## 2014-03-04 LAB — BASIC METABOLIC PANEL
ANION GAP: 11 (ref 5–15)
Anion gap: 11 (ref 5–15)
BUN: 54 mg/dL — AB (ref 6–23)
BUN: 52 mg/dL — ABNORMAL HIGH (ref 6–23)
CALCIUM: 9.5 mg/dL (ref 8.4–10.5)
CALCIUM: 9.6 mg/dL (ref 8.4–10.5)
CO2: 35 mEq/L — ABNORMAL HIGH (ref 19–32)
CO2: 35 mEq/L — ABNORMAL HIGH (ref 19–32)
CREATININE: 0.66 mg/dL (ref 0.50–1.10)
CREATININE: 0.67 mg/dL (ref 0.50–1.10)
Chloride: 97 mEq/L (ref 96–112)
Chloride: 98 mEq/L (ref 96–112)
GFR calc non Af Amer: 72 mL/min — ABNORMAL LOW (ref >90)
GFR calc non Af Amer: 72 mL/min — ABNORMAL LOW (ref >90)
GFR, EST AFRICAN AMERICAN: 84 mL/min — AB (ref >90)
GFR, EST AFRICAN AMERICAN: 83 mL/min — AB (ref >90)
Glucose, Bld: 127 mg/dL — ABNORMAL HIGH (ref 70–99)
Glucose, Bld: 119 mg/dL — ABNORMAL HIGH (ref 70–99)
Potassium: 3.9 mEq/L (ref 3.7–5.3)
Potassium: 4.1 mEq/L (ref 3.7–5.3)
Sodium: 143 mEq/L (ref 137–147)
Sodium: 144 mEq/L (ref 137–147)

## 2014-03-04 LAB — CBC
HEMATOCRIT: 42.9 % (ref 36.0–46.0)
HEMATOCRIT: 41.9 % (ref 36.0–46.0)
HEMOGLOBIN: 13.6 g/dL (ref 12.0–15.0)
Hemoglobin: 14 g/dL (ref 12.0–15.0)
MCH: 31.2 pg (ref 26.0–34.0)
MCH: 30.6 pg (ref 26.0–34.0)
MCHC: 32.6 g/dL (ref 30.0–36.0)
MCHC: 32.5 g/dL (ref 30.0–36.0)
MCV: 95.5 fL (ref 78.0–100.0)
MCV: 94.2 fL (ref 78.0–100.0)
PLATELETS: 163 10*3/uL (ref 150–400)
Platelets: 174 10*3/uL (ref 150–400)
RBC: 4.49 MIL/uL (ref 3.87–5.11)
RBC: 4.45 MIL/uL (ref 3.87–5.11)
RDW: 14.1 % (ref 11.5–15.5)
RDW: 14.2 % (ref 11.5–15.5)
WBC: 12.9 10*3/uL — ABNORMAL HIGH (ref 4.0–10.5)
WBC: 13.4 10*3/uL — AB (ref 4.0–10.5)

## 2014-03-04 NOTE — Progress Notes (Addendum)
[facility]                                                                                              Progress note     Patient Demographics  Delma OfficerGeraldine Siegman, is a 80 y.o. female  YQM:[id number]  [id number]  DOB - 10-03-1914  Admit date - 02/14/2014  Admitting Physician Elnora MorrisonAhmad B Barakat, MD  Outpatient Primary MD for the patient is Pcp Not In System  LOS - 18   Chief complaint      Respiratory failure   COPD   Right lower lobe pneumonia   Diabetes mellitus type 2   Hypertension   Subjective:   Delma OfficerGeraldine Deboer has no complaints today  Objective:   Vital signs  Temperature 97.8 Heart rate 57 Respiratory rate 20 Blood pressure 118/68 Pulse ox 97%  Exam Awake Alert,  confused, No new F.N deficits, [place].AT,PERRAL Supple Neck,No JVD, No cervical lymphadenopathy appriciated.  Symmetrical Chest wall movement,  decreased breath sounds bilaterally with scattered rhonchi  RRR,No Gallops,Rubs or new Murmurs, No Parasternal Heave +ve B.Sounds, Abd Soft, Non tender, No organomegaly appriciated, No rebound - guarding or rigidity. No Cyanosis, Clubbing or edema, No new Rash or bruise     I&Os unknown    Data Review   CBC  Recent Labs Lab 02/27/14 0500 03/03/14 0500 03/04/14 0525 03/04/14 0624  WBC 9.2 9.9 12.9* 13.4*  HGB 9.4* 7.9* 14.0 13.6  HCT 28.9* 25.0* 42.9 41.9  PLT 111* 119* 163 174  MCV 95.7 101.6* 95.5 94.2  MCH 31.1 32.1 31.2 30.6  MCHC 32.5 31.6 32.6 32.5  RDW 13.9 19.2* 14.1 14.2    Chemistries   Recent Labs Lab 02/27/14 0500 03/03/14 0500 03/04/14 0525 03/04/14 0624  NA 140 143 143 144  K 4.5 4.2 3.9 4.1  CL 101 105 97 98  CO2 26 31 35* 35*  GLUCOSE 116* 114* 127* 119*  BUN 31* 25* 54* 52*  CREATININE 0.44* 0.33* 0.66 0.67  CALCIUM 8.9 9.2 9.5 9.6    ------------------------------------------------------------------------------------------------------------------ CrCl is unknown because there is no height on file for the current visit. ------------------------------------------------------------------------------------------------------------------ No results found for this basename: HGBA1C,  in the last 72 hours ------------------------------------------------------------------------------------------------------------------ No results found for this basename: CHOL, HDL, LDLCALC, TRIG, CHOLHDL, LDLDIRECT,  in the last 72 hours ------------------------------------------------------------------------------------------------------------------ No results found for this basename: TSH, T4TOTAL, FREET3, T3FREE, THYROIDAB,  in the last 72 hours ------------------------------------------------------------------------------------------------------------------ No results found for this basename: VITAMINB12, FOLATE, FERRITIN, TIBC, IRON, RETICCTPCT,  in the last 72 hours  Coagulation profile No results found for this basename: INR, PROTIME,  in the last 168 hours  No results found for this basename: DDIMER,  in the last 72 hours  Cardiac Enzymes No results found for this basename: CK, CKMB, TROPONINI, MYOGLOBIN,  in the last 168 hours ------------------------------------------------------------------------------------------------------------------ No components found with this basename: POCBNP,   Micro Results No results found for this or any previous visit (from the past 240 hour(s)).     Assessment & Plan   Respiratory failure, acute on chronic tolerating nasal cannula 5 L per minute;  Right  lower lobe and retrocardiac pneumonia on meropenem COPD continue with bronchodilators and steroids by mouth Hypertension controlled Diabetes mellitus type 2 on Levemir and sliding scale Hyperlipidemia on statin Elevated troponin with ejection  fraction by echo 50% likely demand ischemia Generalized weakness PT and OT following Protein calorie malnutrition/dysphasia continue on dysphagia 2 with nectar thickness Anemia w drop in HB  Plan  BiPAP at night  Grave prognosis Code Status: DO NOT RESUSCITATE   DVT Prophylaxis   Lovenox   Carron Curie M.D on 03/04/2014 at 11:40 AM

## 2014-03-06 LAB — BASIC METABOLIC PANEL
Anion gap: 8 (ref 5–15)
BUN: 46 mg/dL — AB (ref 6–23)
CHLORIDE: 97 meq/L (ref 96–112)
CO2: 36 meq/L — AB (ref 19–32)
Calcium: 9.2 mg/dL (ref 8.4–10.5)
Creatinine, Ser: 0.65 mg/dL (ref 0.50–1.10)
GFR calc Af Amer: 84 mL/min — ABNORMAL LOW (ref >90)
GFR calc non Af Amer: 73 mL/min — ABNORMAL LOW (ref >90)
Glucose, Bld: 127 mg/dL — ABNORMAL HIGH (ref 70–99)
Potassium: 3.7 mEq/L (ref 3.7–5.3)
Sodium: 141 mEq/L (ref 137–147)

## 2014-03-06 LAB — CBC
HEMATOCRIT: 37.2 % (ref 36.0–46.0)
HEMOGLOBIN: 12.2 g/dL (ref 12.0–15.0)
MCH: 30.7 pg (ref 26.0–34.0)
MCHC: 32.8 g/dL (ref 30.0–36.0)
MCV: 93.5 fL (ref 78.0–100.0)
Platelets: 161 10*3/uL (ref 150–400)
RBC: 3.98 MIL/uL (ref 3.87–5.11)
RDW: 14.1 % (ref 11.5–15.5)
WBC: 10.4 10*3/uL (ref 4.0–10.5)

## 2014-03-07 NOTE — Progress Notes (Addendum)
[facility]                                                                                              Progress note     Patient Demographics  Crystal Hodges, is a 80 y.o. female  [id number]  [id number]  DOB - 1915/01/20  Admit date - 02/14/2014  Admitting Physician Elnora MorrisonAhmad B Barakat, MD  Outpatient Primary MD for the patient is Pcp Not In System  LOS - 21   Chief complaint      Respiratory failure   COPD   Right lower lobe pneumonia   Diabetes mellitus type 2   Hypertension   Subjective:   Crystal OfficerGeraldine Samaras confused  today  Objective:   Vital signs  Temperature 98.7 Heart rate 102 Respiratory rate 22 Blood pressure 124/62 Pulse ox 94%  Exam Awake Alert,  confused, No new F.N deficits, [place].AT,PERRAL Supple Neck,No JVD, No cervical lymphadenopathy appriciated.  Symmetrical Chest wall movement,  decreased breath sounds bilaterally with scattered rhonchi  RRR,No Gallops,Rubs or new Murmurs, No Parasternal Heave +ve B.Sounds, Abd Soft, Non tender, No organomegaly appriciated, No rebound - guarding or rigidity. No Cyanosis, Clubbing or edema, No new Rash or bruise     I&Os unknown    Data Review   CBC  Recent Labs Lab 03/03/14 0500 03/04/14 0525 03/04/14 0624 03/06/14 0530  WBC 9.9 12.9* 13.4* 10.4  HGB 7.9* 14.0 13.6 12.2  HCT 25.0* 42.9 41.9 37.2  PLT 119* 163 174 161  MCV 101.6* 95.5 94.2 93.5  MCH 32.1 31.2 30.6 30.7  MCHC 31.6 32.6 32.5 32.8  RDW 19.2* 14.1 14.2 14.1    Chemistries   Recent Labs Lab 03/03/14 0500 03/04/14 0525 03/04/14 0624 03/06/14 0530  NA 143 143 144 141  K 4.2 3.9 4.1 3.7  CL 105 97 98 97  CO2 31 35* 35* 36*  GLUCOSE 114* 127* 119* 127*  BUN 25* 54* 52* 46*  CREATININE 0.33* 0.66 0.67 0.65  CALCIUM 9.2 9.5 9.6 9.2    ------------------------------------------------------------------------------------------------------------------ CrCl is unknown because there is no height on file for the current visit. ------------------------------------------------------------------------------------------------------------------ No results found for this basename: HGBA1C,  in the last 72 hours ------------------------------------------------------------------------------------------------------------------ No results found for this basename: CHOL, HDL, LDLCALC, TRIG, CHOLHDL, LDLDIRECT,  in the last 72 hours ------------------------------------------------------------------------------------------------------------------ No results found for this basename: TSH, T4TOTAL, FREET3, T3FREE, THYROIDAB,  in the last 72 hours ------------------------------------------------------------------------------------------------------------------ No results found for this basename: VITAMINB12, FOLATE, FERRITIN, TIBC, IRON, RETICCTPCT,  in the last 72 hours  Coagulation profile No results found for this basename: INR, PROTIME,  in the last 168 hours  No results found for this basename: DDIMER,  in the last 72 hours  Cardiac Enzymes No results found for this basename: CK, CKMB, TROPONINI, MYOGLOBIN,  in the last 168 hours ------------------------------------------------------------------------------------------------------------------ No components found with this basename: POCBNP,   Micro Results No results found for this or any previous visit (from the past 240 hour(s)).     Assessment & Plan   Respiratory failure, acute on chronic tolerating nasal cannula 5 L per minute; and BiPAP at  night .Placed on hi flow oxymizer today after her BiPAP was held yesterday night. Right lower lobe and retrocardiac pneumonia off meropenem COPD continue with bronchodilators and steroids by mouth Hypertension controlled Diabetes mellitus  type 2 on Levemir and sliding scale Hyperlipidemia on statin Elevated troponin with ejection fraction by echo 50% likely demand ischemia Generalized weakness PT and OT following Protein calorie malnutrition/dysphasia continue on dysphagia 2 with nectar thickness Anemia   Plan  Probable discharge in a.m. to SNF DisContinue PT/OT  Grave prognosis and family is aware Code Status: DO NOT RESUSCITATE   DVT Prophylaxis   Lovenox   Carron Curie M.D on 03/07/2014 at 10:59 AM

## 2014-03-08 NOTE — Progress Notes (Addendum)
[facility]                                                                                              Progress note     Patient Demographics  Crystal Hodges, is a 80 y.o. female  UEA:[id number]  YNW:[id number]  DOB - 1915-05-04  Admit date - 02/14/2014  Admitting Physician Elnora MorrisonAhmad B Barakat, MD  Outpatient Primary MD for the patient is Pcp Not In System  LOS - 22   Chief complaint      Respiratory failure   COPD   Right lower lobe pneumonia   Diabetes mellitus type 2   Hypertension   Subjective:   Crystal OfficerGeraldine Yeagley confused  today  Objective:   Vital signs  Temperature 97.2 Heart rate 60 Respiratory rate 20 Blood pressure 115/65 Pulse ox 100%  Exam Awake Alert,  confused, No new F.N deficits, .AT,PERRAL Supple Neck,No JVD, No cervical lymphadenopathy appriciated.  Symmetrical Chest wall movement,  decreased breath sounds bilaterally with scattered rhonchi  RRR,No Gallops,Rubs or new Murmurs, No Parasternal Heave +ve B.Sounds, Abd Soft, Non tender, No organomegaly appriciated, No rebound - guarding or rigidity. No Cyanosis, Clubbing or edema, No new Rash or bruise     I&Os unknown    Data Review   CBC  Recent Labs Lab 03/03/14 0500 03/04/14 0525 03/04/14 0624 03/06/14 0530  WBC 9.9 12.9* 13.4* 10.4  HGB 7.9* 14.0 13.6 12.2  HCT 25.0* 42.9 41.9 37.2  PLT 119* 163 174 161  MCV 101.6* 95.5 94.2 93.5  MCH 32.1 31.2 30.6 30.7  MCHC 31.6 32.6 32.5 32.8  RDW 19.2* 14.1 14.2 14.1    Chemistries   Recent Labs Lab 03/03/14 0500 03/04/14 0525 03/04/14 0624 03/06/14 0530  NA 143 143 144 141  K 4.2 3.9 4.1 3.7  CL 105 97 98 97  CO2 31 35* 35* 36*  GLUCOSE 114* 127* 119* 127*  BUN 25* 54* 52* 46*  CREATININE 0.33* 0.66 0.67 0.65  CALCIUM 9.2 9.5 9.6 9.2    ------------------------------------------------------------------------------------------------------------------ CrCl is unknown because there is no height on file for the current visit. ------------------------------------------------------------------------------------------------------------------ No results found for this basename: HGBA1C,  in the last 72 hours ------------------------------------------------------------------------------------------------------------------ No results found for this basename: CHOL, HDL, LDLCALC, TRIG, CHOLHDL, LDLDIRECT,  in the last 72 hours ------------------------------------------------------------------------------------------------------------------ No results found for this basename: TSH, T4TOTAL, FREET3, T3FREE, THYROIDAB,  in the last 72 hours ------------------------------------------------------------------------------------------------------------------ No results found for this basename: VITAMINB12, FOLATE, FERRITIN, TIBC, IRON, RETICCTPCT,  in the last 72 hours  Coagulation profile No results found for this basename: INR, PROTIME,  in the last 168 hours  No results found for this basename: DDIMER,  in the last 72 hours  Cardiac Enzymes No results found for this basename: CK, CKMB, TROPONINI, MYOGLOBIN,  in the last 168 hours ------------------------------------------------------------------------------------------------------------------ No components found with this basename: POCBNP,   Micro Results No results found for this or any previous visit (from the past 240 hour(s)).     Assessment & Plan   Respiratory failure, acute on chronic on hi oxygen demand. Right lower lobe and retrocardiac pneumonia  treated COPD continue with bronchodilators and steroids by mouth Hypertension controlled Diabetes mellitus type 2 on Levemir and sliding scale Hyperlipidemia on statin Elevated troponin with ejection fraction by echo 50% likely  demand ischemia Generalized weakness PT and OT following Protein calorie malnutrition/dysphasia continue on dysphagia 2 with nectar thickness Anemia   Plan  Restart BiPAP We will discuss comfort care with family today Critical care time 31 minutes  Grave prognosis and family is aware Code Status: DO NOT RESUSCITATE   DVT Prophylaxis   Lovenox   Carron Curie M.D on 03/08/2014 at 11:54 AM

## 2014-04-05 DEATH — deceased

## 2015-02-06 IMAGING — CR DG ABDOMEN 1V
1 series · 1 of 1 positions shown · non-contrast
Comparison: None.

CLINICAL DATA: Abdominal distention.  Nasogastric tube placement.

EXAM:
ABDOMEN - 1 VIEW

[supine ap]
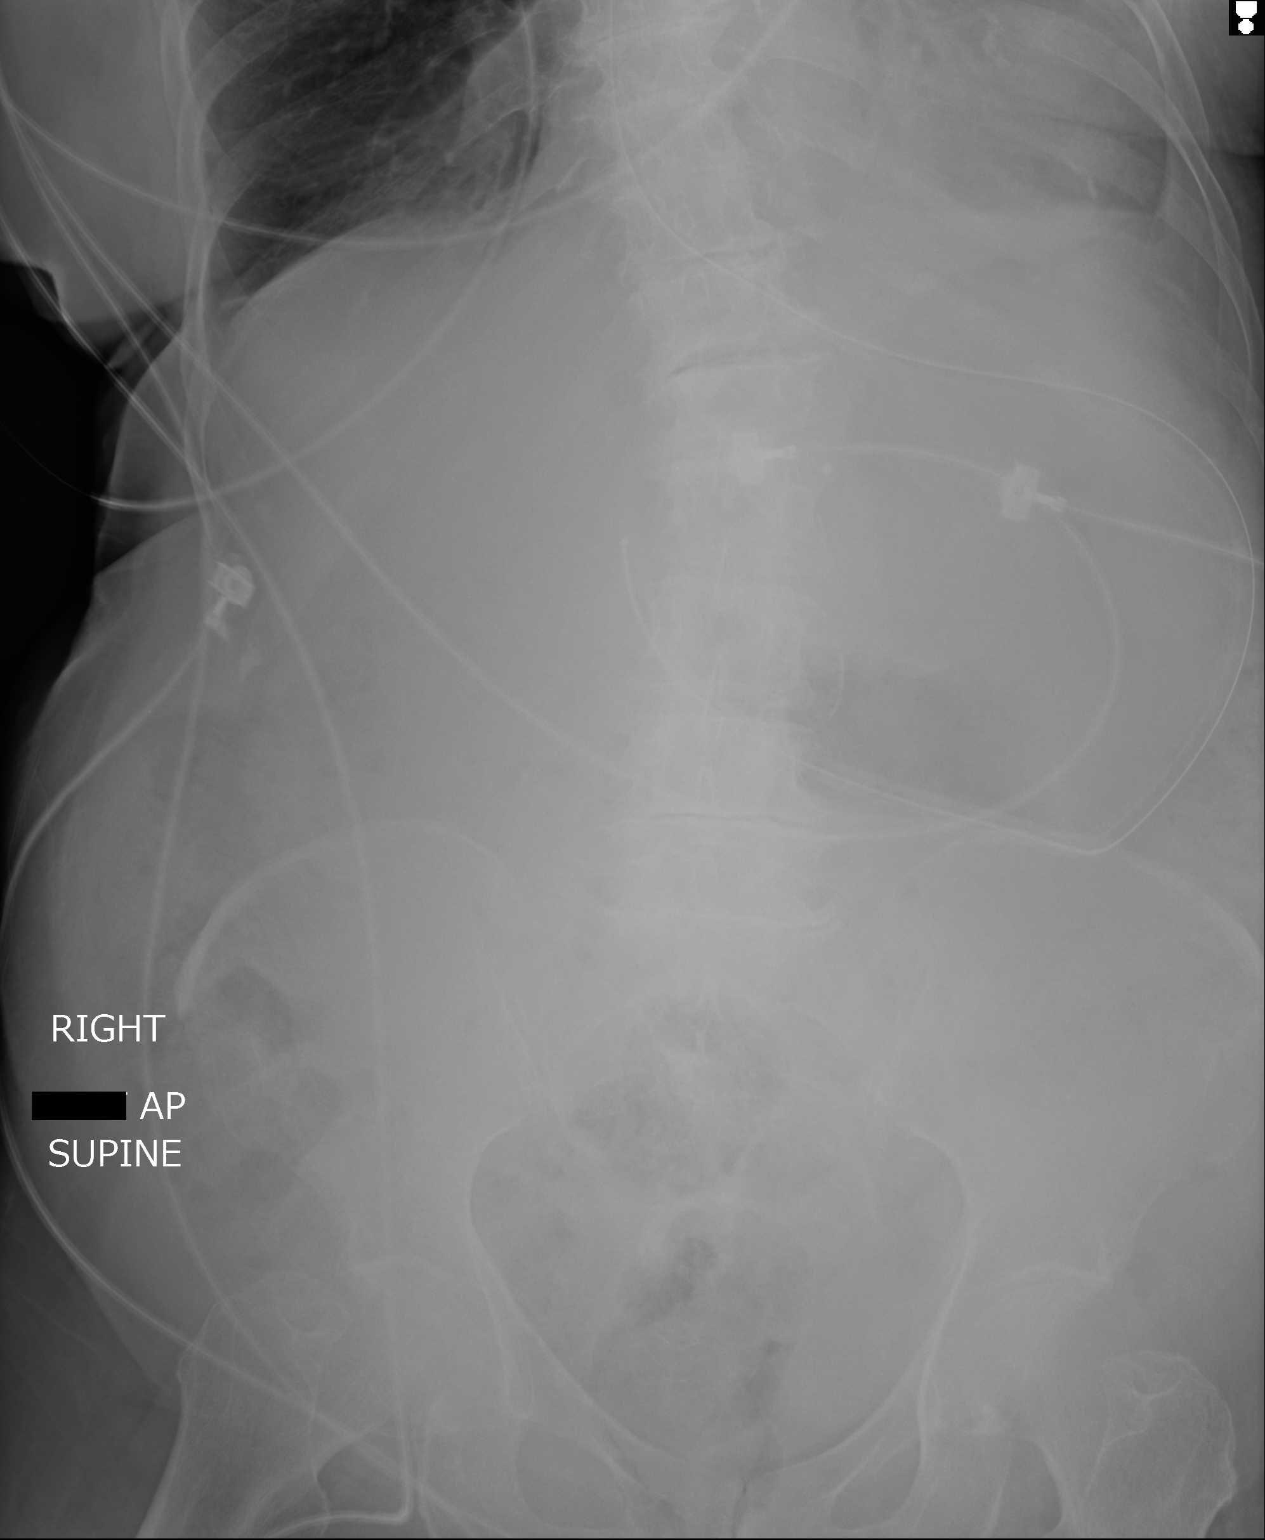

[1 of 1 positions shown; findings below may reference images not displayed]

FINDINGS: Nasogastric tube extends well below the diaphragm into the distal
stomach.

Bowel gas pattern is unremarkable.  No evidence of obstruction.

Soft tissues are not well defined on this exam. There are aortic
vascular calcifications. Soft tissues are otherwise unremarkable.

Bones are demineralized. There are degenerative changes throughout
the visualized spine.
IMPRESSION: No acute findings. Unremarkable bowel gas pattern without evidence
of obstruction. Nasogastric to well-positioned.
